# Patient Record
Sex: Female | Born: 1935 | Race: White | Hispanic: No
Health system: Southern US, Community
[De-identification: ages and names within clinical notes are randomized; demographics above are authoritative.]

---

## 2003-11-29 ENCOUNTER — Other Ambulatory Visit: Payer: Self-pay

## 2004-03-05 ENCOUNTER — Other Ambulatory Visit: Payer: Self-pay

## 2004-06-29 ENCOUNTER — Ambulatory Visit: Payer: Self-pay | Admitting: Urology

## 2005-03-23 ENCOUNTER — Ambulatory Visit: Payer: Self-pay | Admitting: Internal Medicine

## 2006-04-05 ENCOUNTER — Ambulatory Visit: Payer: Self-pay | Admitting: Internal Medicine

## 2006-08-31 ENCOUNTER — Ambulatory Visit: Payer: Self-pay | Admitting: Internal Medicine

## 2006-09-19 ENCOUNTER — Inpatient Hospital Stay: Payer: Self-pay | Admitting: Vascular Surgery

## 2006-09-19 ENCOUNTER — Other Ambulatory Visit: Payer: Self-pay

## 2006-10-08 ENCOUNTER — Other Ambulatory Visit: Payer: Self-pay

## 2006-10-08 ENCOUNTER — Inpatient Hospital Stay: Payer: Self-pay | Admitting: Internal Medicine

## 2006-10-09 ENCOUNTER — Other Ambulatory Visit: Payer: Self-pay

## 2006-11-14 ENCOUNTER — Ambulatory Visit: Payer: Self-pay | Admitting: Internal Medicine

## 2006-11-21 ENCOUNTER — Ambulatory Visit: Payer: Self-pay | Admitting: Internal Medicine

## 2006-12-07 ENCOUNTER — Ambulatory Visit: Payer: Self-pay | Admitting: Internal Medicine

## 2006-12-21 ENCOUNTER — Ambulatory Visit: Payer: Self-pay | Admitting: Internal Medicine

## 2007-01-21 ENCOUNTER — Ambulatory Visit: Payer: Self-pay | Admitting: Internal Medicine

## 2007-02-20 ENCOUNTER — Ambulatory Visit: Payer: Self-pay | Admitting: Internal Medicine

## 2007-03-23 ENCOUNTER — Ambulatory Visit: Payer: Self-pay | Admitting: Internal Medicine

## 2007-04-23 ENCOUNTER — Ambulatory Visit: Payer: Self-pay | Admitting: Internal Medicine

## 2007-04-25 ENCOUNTER — Emergency Department: Payer: Self-pay | Admitting: Emergency Medicine

## 2007-04-25 ENCOUNTER — Inpatient Hospital Stay (HOSPITAL_COMMUNITY): Admission: EM | Admit: 2007-04-25 | Discharge: 2007-05-01 | Payer: Self-pay | Admitting: Neurosurgery

## 2007-05-01 ENCOUNTER — Encounter: Payer: Self-pay | Admitting: Internal Medicine

## 2007-05-09 ENCOUNTER — Ambulatory Visit: Payer: Self-pay | Admitting: Oncology

## 2007-05-14 ENCOUNTER — Ambulatory Visit: Payer: Self-pay | Admitting: Internal Medicine

## 2007-05-23 ENCOUNTER — Ambulatory Visit: Payer: Self-pay | Admitting: Oncology

## 2007-05-23 ENCOUNTER — Ambulatory Visit: Payer: Self-pay | Admitting: Internal Medicine

## 2007-05-23 ENCOUNTER — Encounter: Payer: Self-pay | Admitting: Internal Medicine

## 2007-06-23 ENCOUNTER — Encounter: Payer: Self-pay | Admitting: Internal Medicine

## 2007-06-23 ENCOUNTER — Ambulatory Visit: Payer: Self-pay | Admitting: Oncology

## 2007-06-23 ENCOUNTER — Ambulatory Visit: Payer: Self-pay | Admitting: Internal Medicine

## 2007-07-23 ENCOUNTER — Ambulatory Visit: Payer: Self-pay | Admitting: Oncology

## 2007-07-23 ENCOUNTER — Encounter: Payer: Self-pay | Admitting: Internal Medicine

## 2007-07-26 ENCOUNTER — Ambulatory Visit: Payer: Self-pay | Admitting: Internal Medicine

## 2007-08-02 ENCOUNTER — Ambulatory Visit: Payer: Self-pay | Admitting: Oncology

## 2007-08-23 ENCOUNTER — Ambulatory Visit: Payer: Self-pay | Admitting: Oncology

## 2007-09-23 ENCOUNTER — Ambulatory Visit: Payer: Self-pay | Admitting: Oncology

## 2007-10-21 ENCOUNTER — Ambulatory Visit: Payer: Self-pay | Admitting: Oncology

## 2007-11-21 ENCOUNTER — Ambulatory Visit: Payer: Self-pay | Admitting: Oncology

## 2007-12-21 ENCOUNTER — Ambulatory Visit: Payer: Self-pay | Admitting: Oncology

## 2008-01-01 ENCOUNTER — Ambulatory Visit: Payer: Self-pay | Admitting: Internal Medicine

## 2008-02-13 ENCOUNTER — Ambulatory Visit: Payer: Self-pay | Admitting: Oncology

## 2008-02-20 ENCOUNTER — Ambulatory Visit: Payer: Self-pay | Admitting: Oncology

## 2008-04-22 ENCOUNTER — Ambulatory Visit: Payer: Self-pay | Admitting: Oncology

## 2008-05-14 ENCOUNTER — Ambulatory Visit: Payer: Self-pay | Admitting: Oncology

## 2008-05-22 ENCOUNTER — Ambulatory Visit: Payer: Self-pay | Admitting: Oncology

## 2008-06-24 IMAGING — CT CT HEAD WITHOUT CONTRAST
2 series · 16 of 30 positions shown, 20 images · non-contrast
Comparison: none

REASON FOR EXAM: head trauma  subdural hematoma  vertigo nausea
COMMENTS:

[Series 2: without · axial · non-contrast · 0.39mm/px · z∈[-148,-28]mm · 13 of 29 slices shown, 17 images]
[im 3/29  brain]
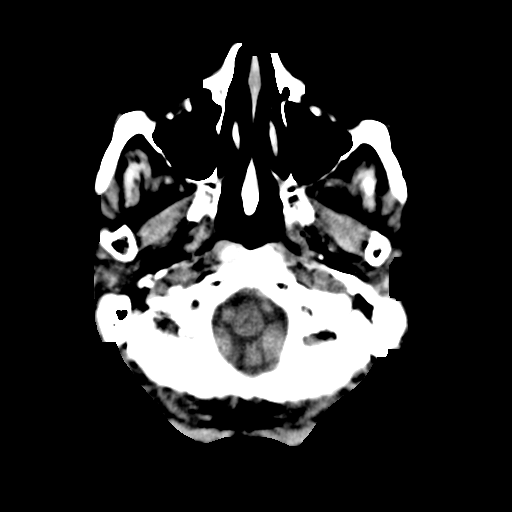
[im 3/29  bone]
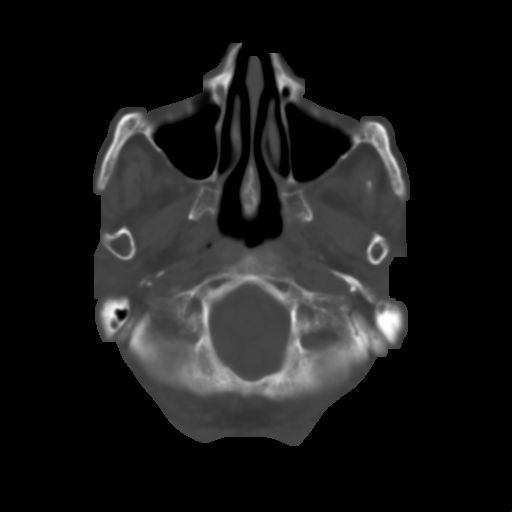
[im 5/29  brain]
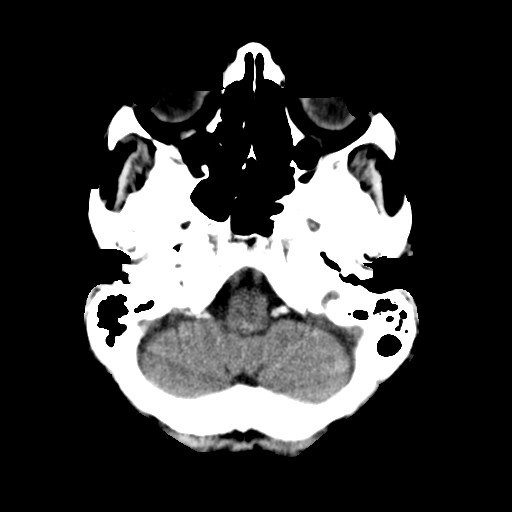
[im 7/29  brain]
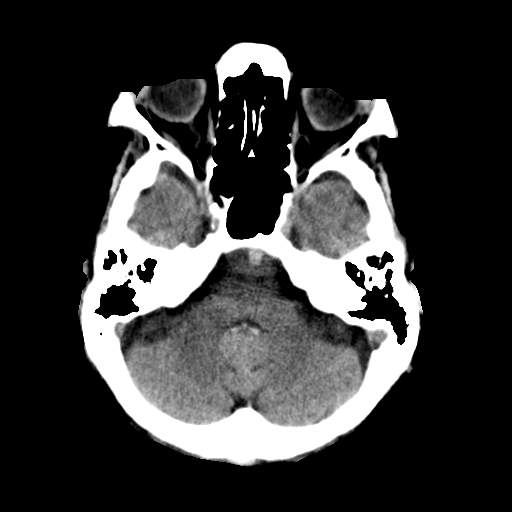
[im 9/29  brain]
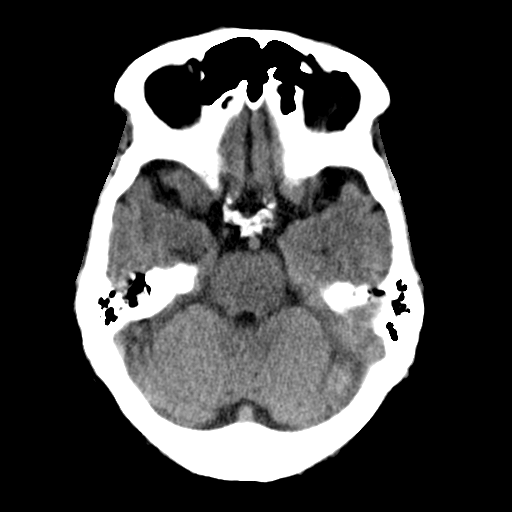
[im 11/29  brain]
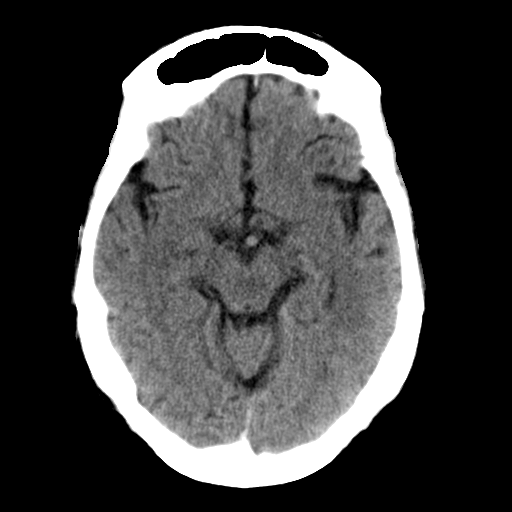
[im 11/29  bone]
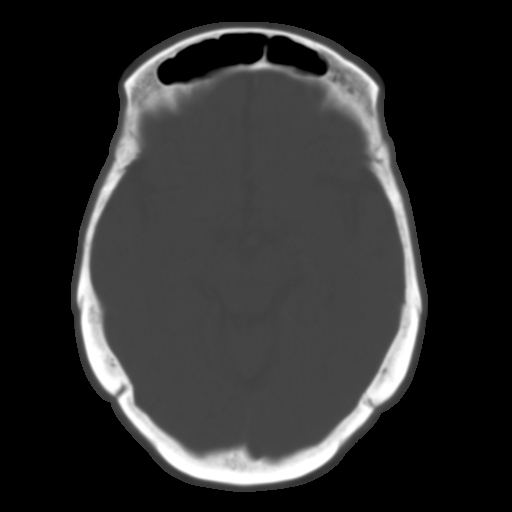
[im 13/29  brain]
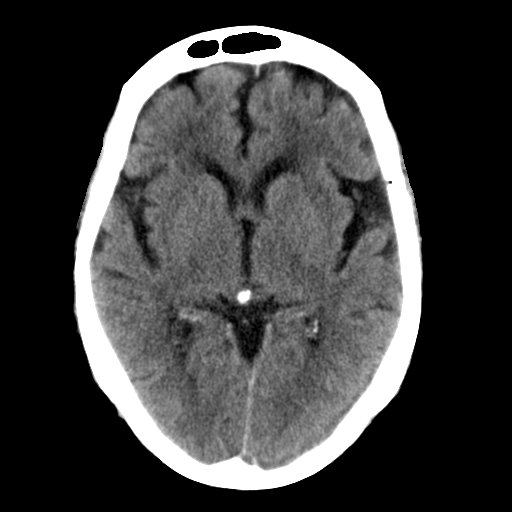
[im 15/29  brain]
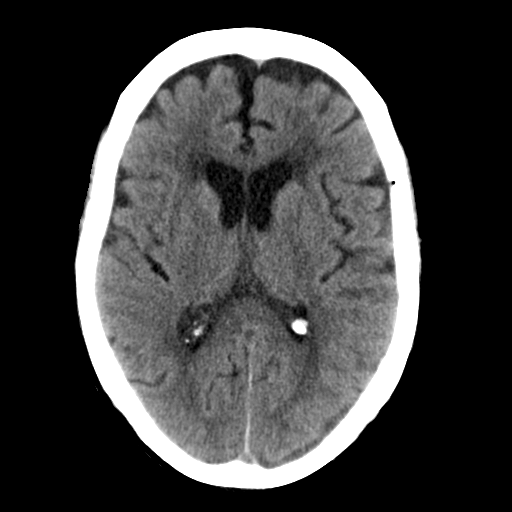
[im 17/29  brain]
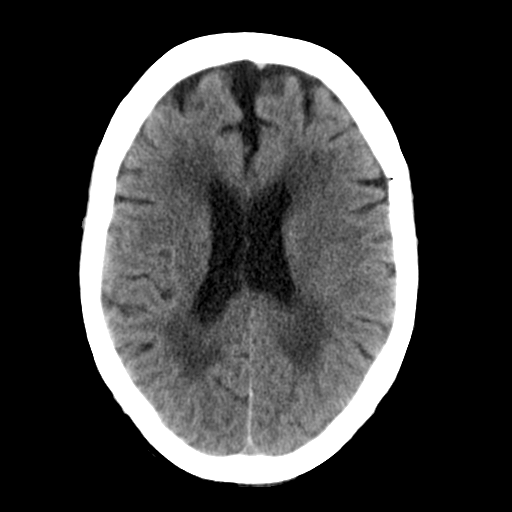
[im 19/29  brain]
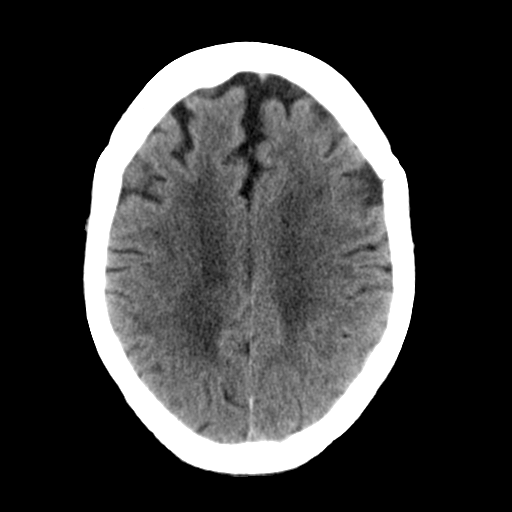
[im 19/29  bone]
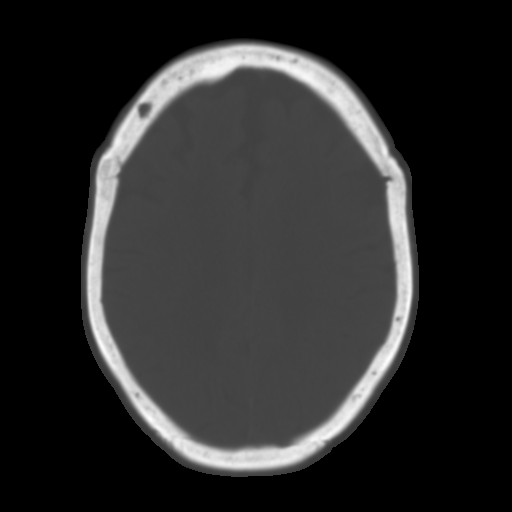
[im 21/29  brain]
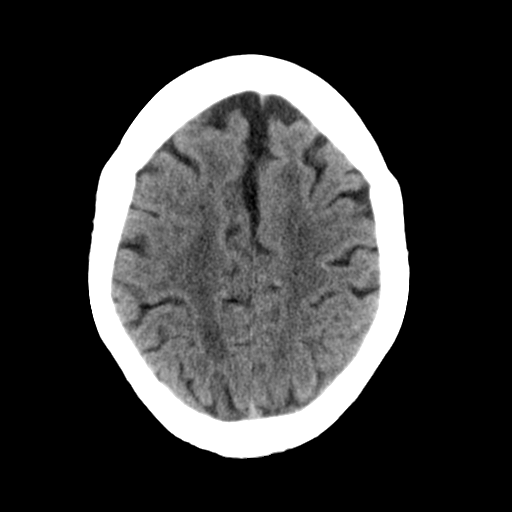
[im 23/29  brain]
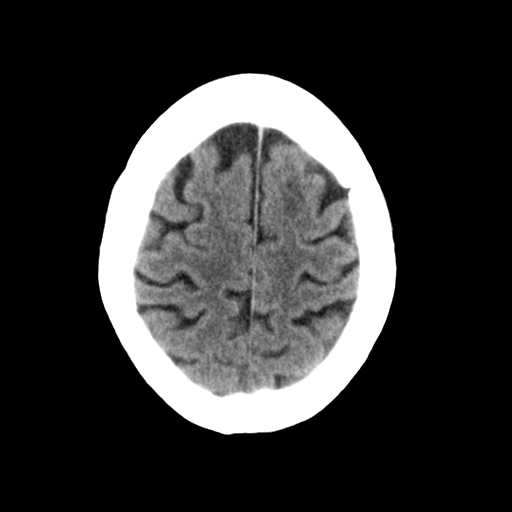
[im 25/29  brain]
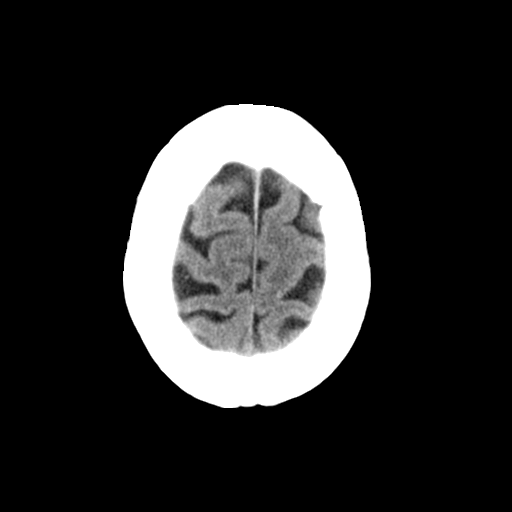
[im 27/29  brain]
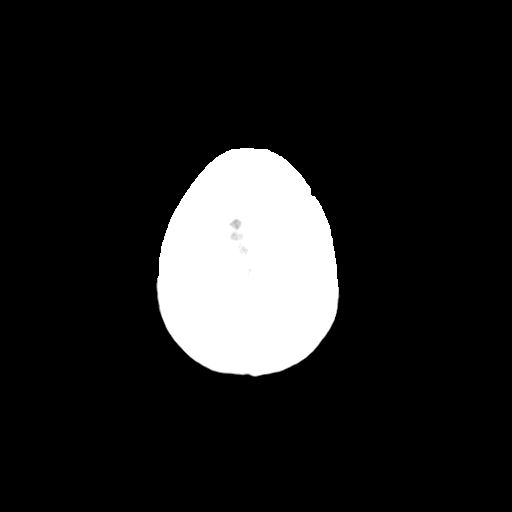
[im 27/29  bone]
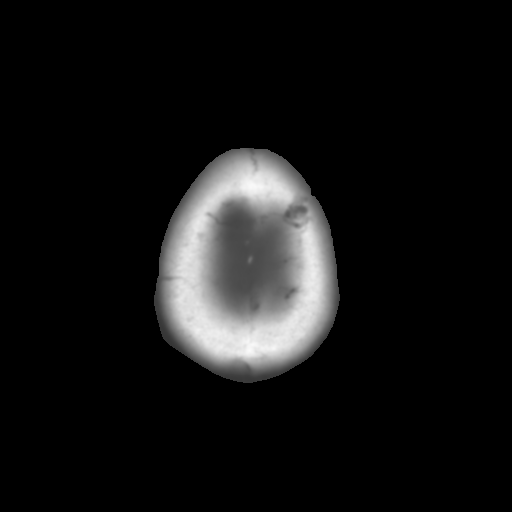

[Series 3: bone · axial · 0.39mm/px · z∈[-148,-108]mm · 3 of 29 slices shown]
[im 3/29  bone]
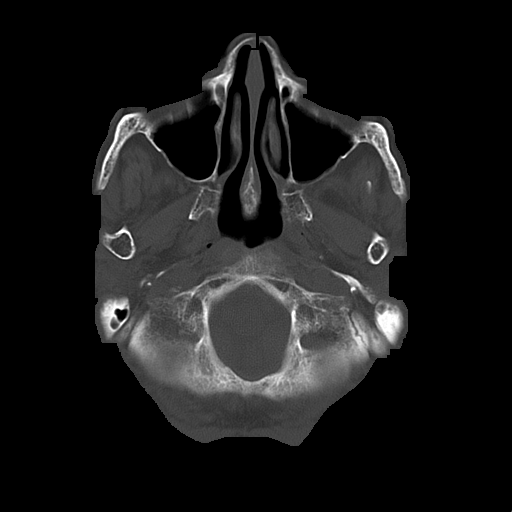
[im 7/29  bone]
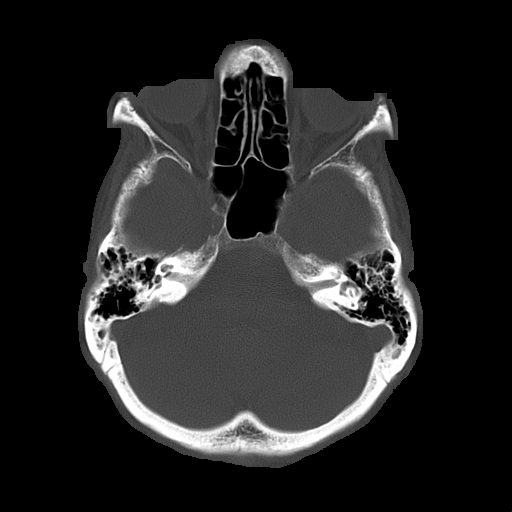
[im 11/29  bone]
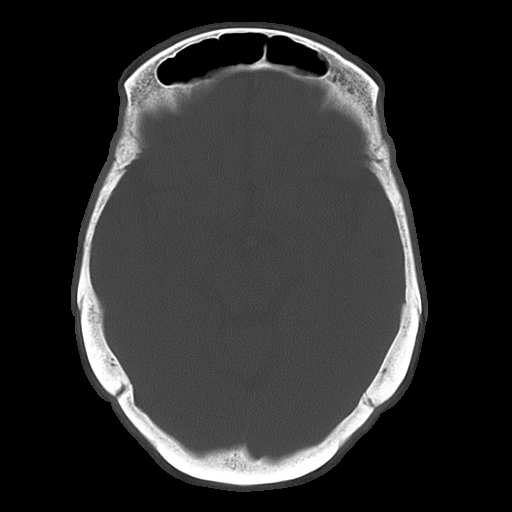

[16 of 30 positions shown; findings below may reference images not displayed]

PROCEDURE:     CT  - CT HEAD WITHOUT CONTRAST  - May 14, 2007  [DATE]

RESULT:     Comparison is made to a study 25 April, 2007. The patient
has sustained head trauma.

There is moderate diffuse cerebral and cerebellar atrophy. The ventricles
are normal in size and position. There is deep white matter hypodensity
consistent with chronic small vessel ischemic type change of aging. There is
no evidence of an acute intracranial hemorrhage or an evolving ischemic
infarction. At bone window settings I see no air-fluid levels and no
evidence of an acute skull fracture.
IMPRESSION: 1.     There are extensive chronic changes present within the brain which
are unchanged from the study [DATE]. There is no evidence of an
acute ischemic or hemorrhagic event. There is no evidence of hydrocephalus.

## 2008-08-22 ENCOUNTER — Ambulatory Visit: Payer: Self-pay | Admitting: Oncology

## 2008-09-05 IMAGING — CT CT CHEST W/O CM
1 of 2 series · 14 of 32 positions shown, 18 images · non-contrast
Comparison: none

REASON FOR EXAM: abnormal lung findings    pulmonary nodule   elevated
creatinine
COMMENTS:

PROCEDURE:     CT  - CT CHEST WITHOUT CONTRAST  - July 26, 2007 [DATE]
RESULT:
HISTORY: Lingular nodule.

[Series 3: lung windows · axial · 0.61mm/px · z∈[+231,+506]mm · 14 of 67 slices shown, 18 images]
[im 6/67  mediastinal]
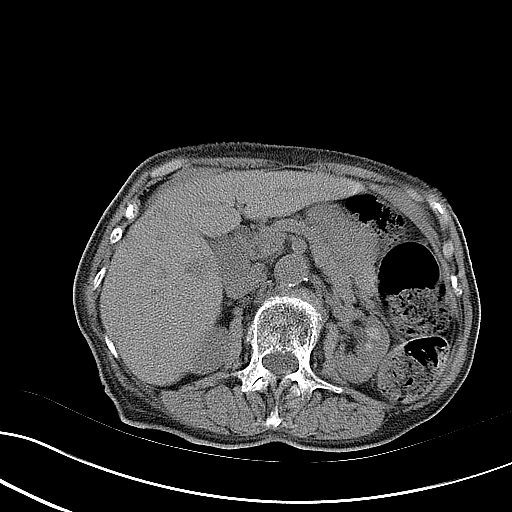
[im 6/67  lung]
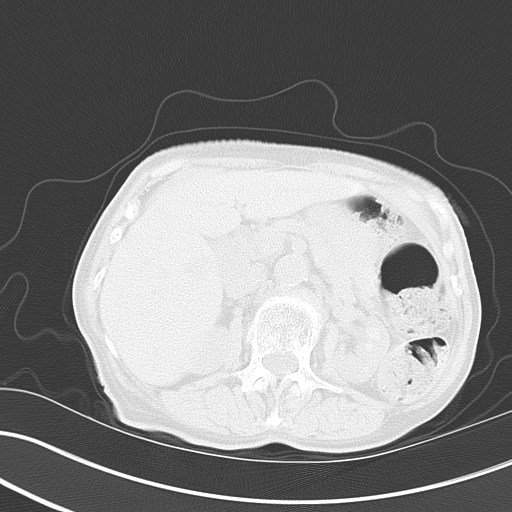
[im 11/67  lung]
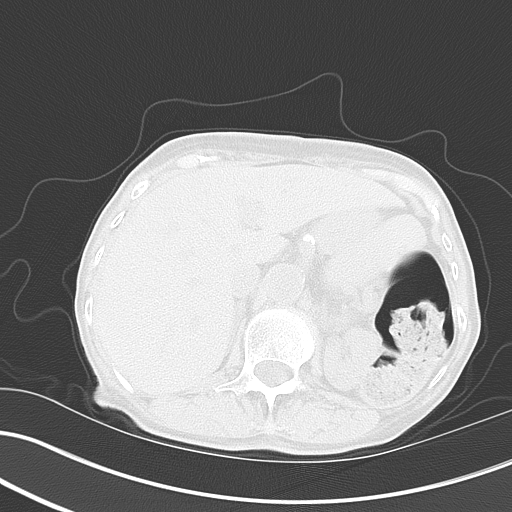
[im 16/67  lung]
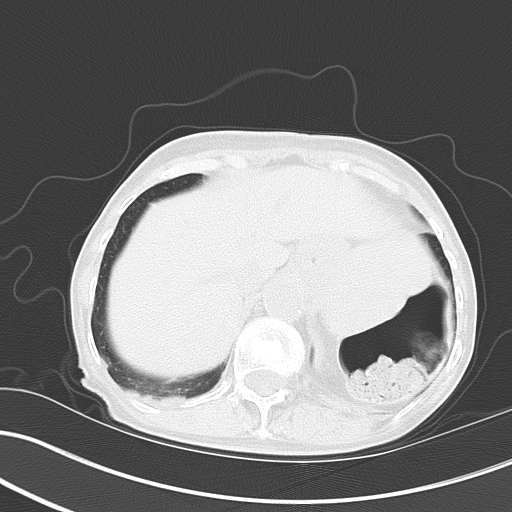
[im 21/67  lung]
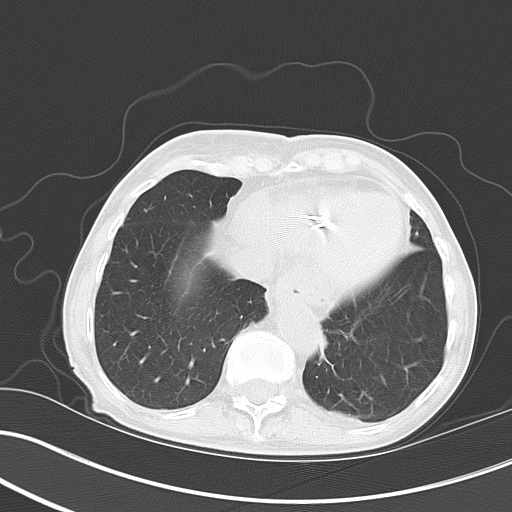
[im 26/67  mediastinal]
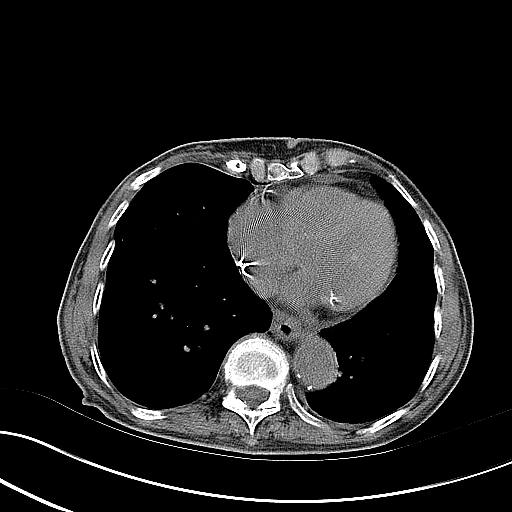
[im 26/67  lung]
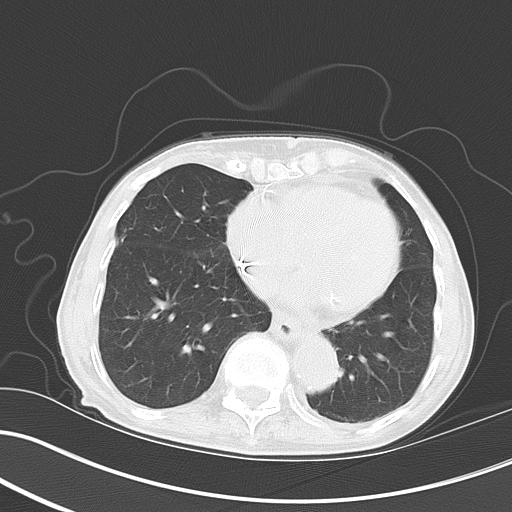
[im 31/67  lung]
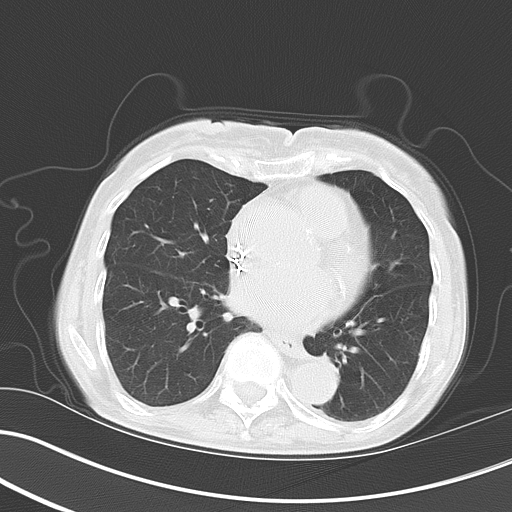
[im 32/67  lung]
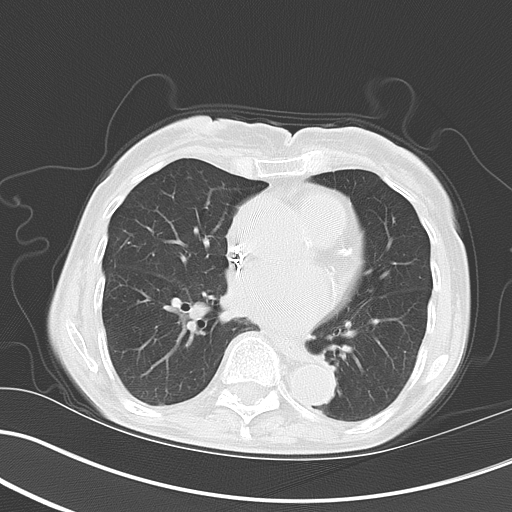
[im 34/67  lung]
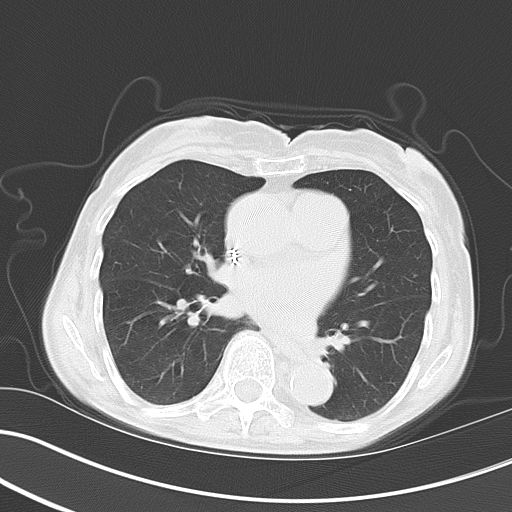
[im 36/67  mediastinal]
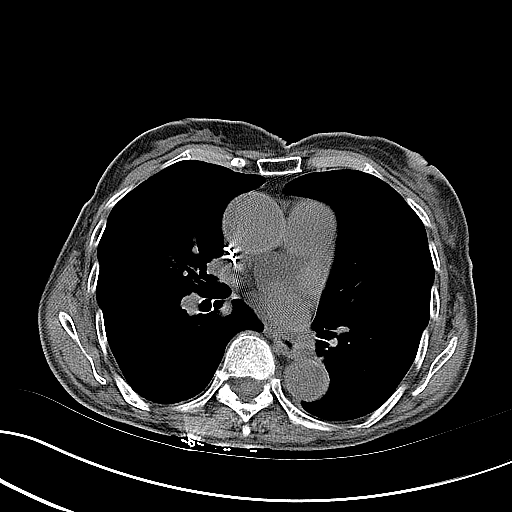
[im 36/67  lung]
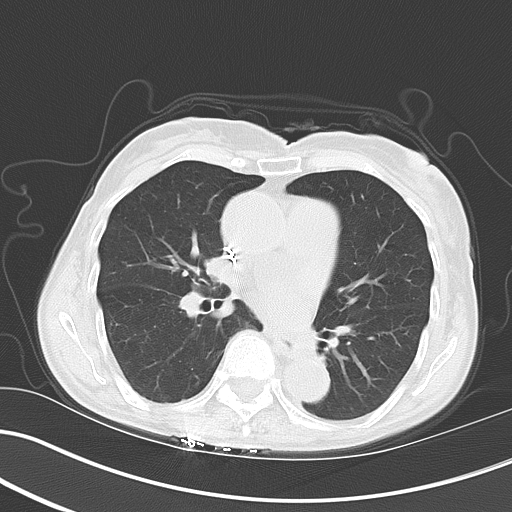
[im 41/67  lung]
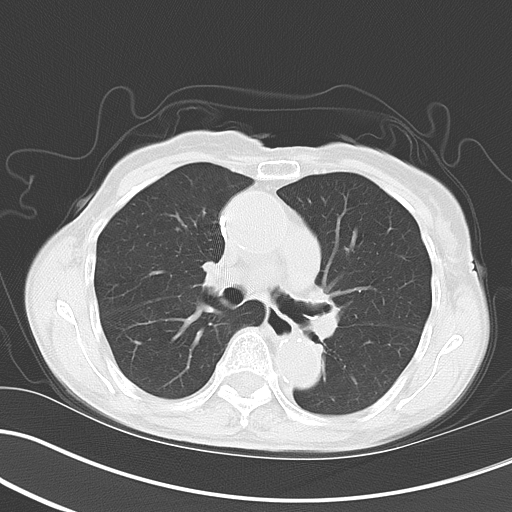
[im 46/67  lung]
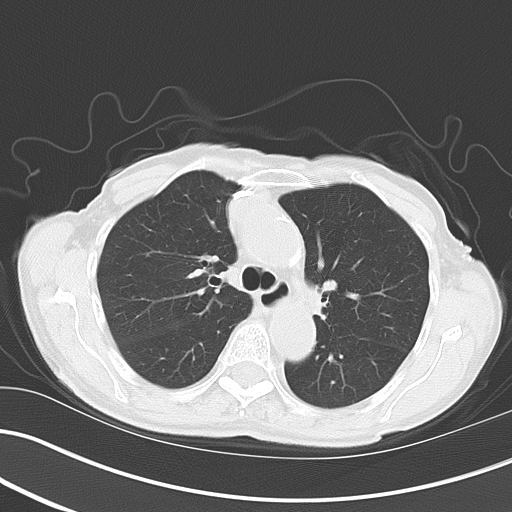
[im 51/67  lung]
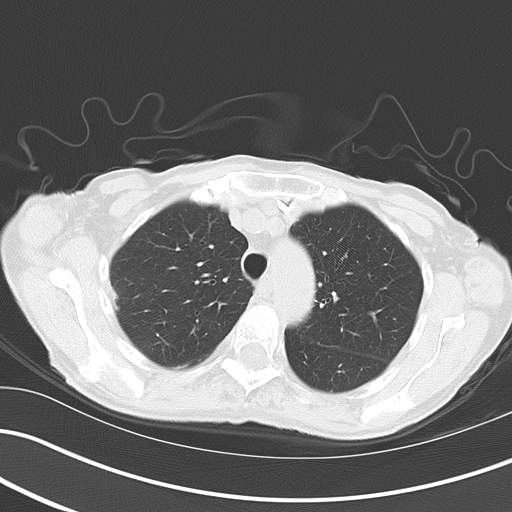
[im 56/67  mediastinal]
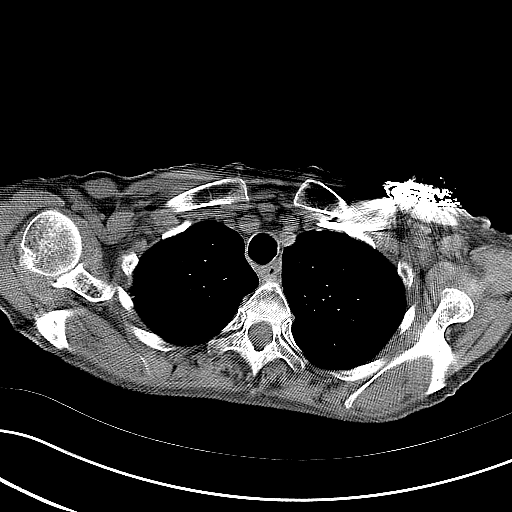
[im 56/67  lung]
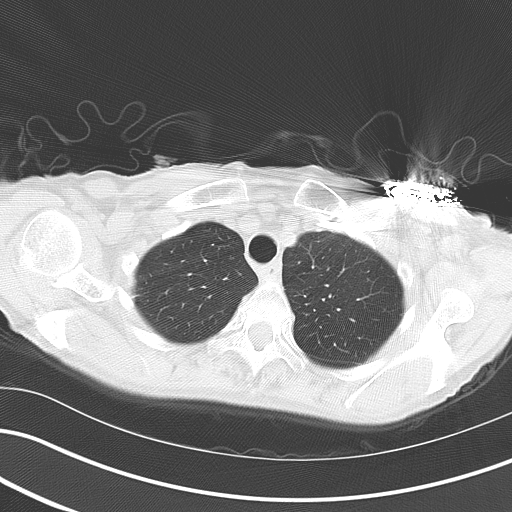
[im 61/67  lung]
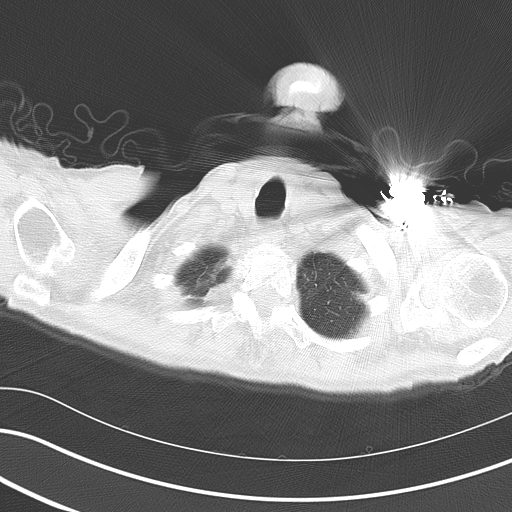

[14 of 32 positions shown; findings below may reference images not displayed]

COMPARISON STUDIES:   Portable chest of 04/25/07 and chest CT of 12/07/06.

PROCEDURE AND FINDINGS:  Nonenhanced CT of the chest was obtained.  The
patient has an elevated Creatinine. No IV contrast was administered.
Previously identified lingular nodule is stable and unchanged in size and
shape. The remainder of the lung fields are essentially clear with basilar
atelectasis and/or scarring.  Small mediastinal lymph nodes are noted.
Pulmonary artery disease is present.  RIGHT renal cyst is present.  The
patient has a cardiac pacer.  The mediastinum and hilar structures are
difficult to evaluate due to absence of IV contrast. Chest wall is
unremarkable.
IMPRESSION: Stable tiny lingular nodule.  A full year follow up chest CT would be due
[DATE]. 6-month follow up chest CT for a two year period can be obtained to
demonstrate stability as clinically indicated. This nodule can be followed
at the pulmonary nodule clinic with oncology as so desired.

## 2008-09-11 ENCOUNTER — Ambulatory Visit: Payer: Self-pay | Admitting: Oncology

## 2008-09-22 ENCOUNTER — Ambulatory Visit: Payer: Self-pay | Admitting: Oncology

## 2008-11-20 ENCOUNTER — Ambulatory Visit: Payer: Self-pay | Admitting: Oncology

## 2008-12-10 ENCOUNTER — Ambulatory Visit: Payer: Self-pay | Admitting: Oncology

## 2008-12-20 ENCOUNTER — Ambulatory Visit: Payer: Self-pay | Admitting: Oncology

## 2009-02-02 ENCOUNTER — Ambulatory Visit: Payer: Self-pay | Admitting: Internal Medicine

## 2009-12-22 ENCOUNTER — Ambulatory Visit: Payer: Self-pay | Admitting: Urology

## 2010-01-05 ENCOUNTER — Ambulatory Visit: Payer: Self-pay | Admitting: Urology

## 2011-01-04 NOTE — Discharge Summary (Signed)
NAME:  Alice Hensley, Alice Hensley NO.:  [id number]   MEDICAL RECORD NO.:  [id number]          PATIENT TYPE:  INP   LOCATION:  3017                         FACILITY:  [facility]   PHYSICIAN:  Reinaldo Meeker, M.D. DATE OF BIRTH:  04/03/1919   DATE OF ADMISSION:  04/25/2007  DATE OF DISCHARGE:  05/01/2007                               DISCHARGE SUMMARY   FINAL DIAGNOSIS:  Left parietal subdural hematoma.   PRIMARY OPERATIVE PROCEDURE:  None.   HISTORY:  Alice Hensley is an 77 year old female with a reported syncopal  episode today with a subsequent fall.  She went to the [facility]  Emergency Room where CT scan showed a thin left parietal subdural.  She  was transferred to [facility] for neurosurgical care.  When admitted and  during her entire hospital stay, the patient was awake, alert, and  oriented with normal strength and could follow complex commands.  Her  verbalization was appropriate and fluent.  Her CT scan did show the thin  subdural hematoma.  The patient was admitted for observation to the ICU  and, over subsequent days, was able to slowly increase her activities.  She still had some vertigo which had been an ongoing long problem.  The  plan was for her to follow up with her medical doctor after discharge  and get that reevaluated.  She had three serial CT scans which showed  only some early resolution and no worsening of her subdural and no mass  effect or midline shift.  By May 01, 2007, she was tolerating a  regular diet, sitting up in chair, and so we increased her activities.  It was felt that she was ready for release from the acute care but  needed standard nursing facility and one was found in [place] for her  further treatment.  The plan was for her to contact her medical doctor,  Dr. Dellis Filbert, so he could reevaluate her syncope and vertigo.   CONDITION:  Her condition was improved versus admission.   DISCHARGE MEDICATIONS:  Bactrim, Darvocet, imipramine,  Toprol,  nitrofurantoin, and pravastatin.           ______________________________  Reinaldo Meeker, M.D.     ROK/MEDQ  D:  05/01/2007  T:  05/01/2007  Job:  [id number]

## 2011-06-03 LAB — URINALYSIS, ROUTINE W REFLEX MICROSCOPIC
Bilirubin Urine: NEGATIVE
Nitrite: NEGATIVE
Specific Gravity, Urine: 1.011
pH: 6

## 2011-06-03 LAB — URINE MICROSCOPIC-ADD ON

## 2011-07-20 ENCOUNTER — Ambulatory Visit: Payer: Self-pay | Admitting: Internal Medicine

## 2012-05-23 ENCOUNTER — Emergency Department: Payer: Self-pay

## 2012-12-03 ENCOUNTER — Emergency Department: Payer: Self-pay | Admitting: Emergency Medicine

## 2012-12-03 LAB — CBC WITH DIFFERENTIAL/PLATELET
Basophil %: 0.3 %
Eosinophil %: 0 %
Lymphocyte %: 12.3 %
MCH: 34.9 pg — ABNORMAL HIGH (ref 26.0–34.0)
MCHC: 33.1 g/dL (ref 32.0–36.0)
Monocyte #: 0.6 x10 3/mm (ref 0.2–0.9)
Neutrophil %: 79.4 %
Platelet: 134 10*3/uL — ABNORMAL LOW (ref 150–440)
RBC: 2.47 10*6/uL — ABNORMAL LOW (ref 3.80–5.20)

## 2012-12-03 LAB — COMPREHENSIVE METABOLIC PANEL
Albumin: 3.4 g/dL (ref 3.4–5.0)
Alkaline Phosphatase: 116 U/L (ref 50–136)
Anion Gap: 10 (ref 7–16)
BUN: 82 mg/dL — ABNORMAL HIGH (ref 7–18)
Glucose: 103 mg/dL — ABNORMAL HIGH (ref 65–99)
Osmolality: 303 (ref 275–301)
Potassium: 4.4 mmol/L (ref 3.5–5.1)

## 2012-12-03 LAB — URINALYSIS, COMPLETE
Ketone: NEGATIVE
RBC,UR: <1 /HPF (ref 0–5)
Squamous Epithelial: 1
WBC UR: 3 /HPF (ref 0–5)

## 2012-12-03 LAB — TROPONIN I: Troponin-I: 0.04 ng/mL

## 2012-12-03 LAB — CK: CK, Total: 119 U/L (ref 21–215)

## 2012-12-09 LAB — CULTURE, BLOOD (SINGLE)

## 2013-01-17 LAB — CBC WITH DIFFERENTIAL/PLATELET
Basophil #: 0 10*3/uL (ref 0.0–0.1)
Basophil %: 0.5 %
Eosinophil #: 0 10*3/uL (ref 0.0–0.7)
Lymphocyte #: 0.9 10*3/uL — ABNORMAL LOW (ref 1.0–3.6)
Lymphocyte %: 13.4 %
MCH: 33.3 pg (ref 26.0–34.0)
MCV: 102 fL — ABNORMAL HIGH (ref 80–100)
Monocyte %: 10 %
Neutrophil #: 5.3 10*3/uL (ref 1.4–6.5)
WBC: 6.9 10*3/uL (ref 3.6–11.0)

## 2013-01-17 LAB — BASIC METABOLIC PANEL
Calcium, Total: 9 mg/dL (ref 8.5–10.1)
Chloride: 106 mmol/L (ref 98–107)
Co2: 28 mmol/L (ref 21–32)
Creatinine: 1.88 mg/dL — ABNORMAL HIGH (ref 0.60–1.30)
EGFR (Non-African Amer.): 23 — ABNORMAL LOW
Osmolality: 294 (ref 275–301)
Sodium: 140 mmol/L (ref 136–145)

## 2013-01-18 ENCOUNTER — Inpatient Hospital Stay: Payer: Self-pay | Admitting: Internal Medicine

## 2013-01-19 LAB — CBC WITH DIFFERENTIAL/PLATELET
Basophil #: 0 10*3/uL (ref 0.0–0.1)
Basophil %: 0.6 %
Eosinophil #: 0.1 10*3/uL (ref 0.0–0.7)
HCT: 27.2 % — ABNORMAL LOW (ref 35.0–47.0)
HGB: 9 g/dL — ABNORMAL LOW (ref 12.0–16.0)
Lymphocyte #: 1 10*3/uL (ref 1.0–3.6)
Lymphocyte %: 16.5 %
MCHC: 33.1 g/dL (ref 32.0–36.0)
Monocyte #: 0.7 x10 3/mm (ref 0.2–0.9)
Neutrophil %: 69.9 %

## 2013-01-19 LAB — BASIC METABOLIC PANEL
Anion Gap: 7 (ref 7–16)
BUN: 48 mg/dL — ABNORMAL HIGH (ref 7–18)
Calcium, Total: 8.2 mg/dL — ABNORMAL LOW (ref 8.5–10.1)
Co2: 29 mmol/L (ref 21–32)
Glucose: 97 mg/dL (ref 65–99)
Osmolality: 294 (ref 275–301)
Sodium: 141 mmol/L (ref 136–145)

## 2013-01-20 LAB — BASIC METABOLIC PANEL
Anion Gap: 7 (ref 7–16)
BUN: 50 mg/dL — ABNORMAL HIGH (ref 7–18)
Chloride: 102 mmol/L (ref 98–107)
EGFR (African American): 23 — ABNORMAL LOW
EGFR (Non-African Amer.): 20 — ABNORMAL LOW
Glucose: 99 mg/dL (ref 65–99)
Osmolality: 289 (ref 275–301)
Sodium: 138 mmol/L (ref 136–145)

## 2013-01-21 LAB — BASIC METABOLIC PANEL
BUN: 58 mg/dL — ABNORMAL HIGH (ref 7–18)
Calcium, Total: 8.8 mg/dL (ref 8.5–10.1)
EGFR (African American): 22 — ABNORMAL LOW
EGFR (Non-African Amer.): 19 — ABNORMAL LOW
Osmolality: 294 (ref 275–301)
Potassium: 4.5 mmol/L (ref 3.5–5.1)

## 2013-01-23 LAB — CULTURE, BLOOD (SINGLE)

## 2013-02-12 ENCOUNTER — Emergency Department: Payer: Self-pay | Admitting: Emergency Medicine

## 2013-02-12 LAB — CK TOTAL AND CKMB (NOT AT ARMC)
CK, Total: 31 U/L (ref 21–215)
CK-MB: 1.9 ng/mL (ref 0.5–3.6)

## 2013-02-12 LAB — TROPONIN I: Troponin-I: 0.02 ng/mL

## 2013-02-12 LAB — URINALYSIS, COMPLETE
Blood: NEGATIVE
Glucose,UR: NEGATIVE mg/dL (ref 0–75)
Nitrite: POSITIVE
RBC,UR: 1 /HPF (ref 0–5)
Specific Gravity: 1.009 (ref 1.003–1.030)
WBC UR: 38 /HPF (ref 0–5)

## 2013-02-12 LAB — CBC
HGB: 9.2 g/dL — ABNORMAL LOW (ref 12.0–16.0)
MCH: 33.8 pg (ref 26.0–34.0)
MCV: 100 fL (ref 80–100)
Platelet: 137 10*3/uL — ABNORMAL LOW (ref 150–440)
RBC: 2.73 10*6/uL — ABNORMAL LOW (ref 3.80–5.20)
RDW: 13.8 % (ref 11.5–14.5)

## 2013-02-12 LAB — COMPREHENSIVE METABOLIC PANEL
Anion Gap: 5 — ABNORMAL LOW (ref 7–16)
Co2: 31 mmol/L (ref 21–32)
EGFR (African American): 24 — ABNORMAL LOW
Glucose: 85 mg/dL (ref 65–99)

## 2013-06-12 ENCOUNTER — Emergency Department: Payer: Self-pay | Admitting: Internal Medicine

## 2013-06-28 ENCOUNTER — Emergency Department: Payer: Self-pay | Admitting: Emergency Medicine

## 2013-07-14 ENCOUNTER — Emergency Department: Payer: Self-pay | Admitting: Emergency Medicine

## 2013-08-02 ENCOUNTER — Emergency Department: Payer: Self-pay | Admitting: Emergency Medicine

## 2013-08-02 LAB — URINALYSIS, COMPLETE
Blood: NEGATIVE
Glucose,UR: NEGATIVE mg/dL (ref 0–75)
Nitrite: NEGATIVE
RBC,UR: <1 /HPF (ref 0–5)
Squamous Epithelial: 2

## 2013-09-22 DEATH — deceased

## 2014-12-12 NOTE — Discharge Summary (Signed)
PATIENT NAME:  Alice Hensley, Alice Hensley MR#:  [id number] DATE OF BIRTH:  22-Apr-1919  DATE OF ADMISSION:  01/18/2013 DATE OF DISCHARGE:    DISCHARGE DIAGNOSES:  1.  Pubic rami and sacral fractures.  2.  Congestive heart failure secondary to mitral regurgitation, ejection fraction 45%.  3.  Dementia, moderate.  4.  Hypertension.   DISCHARGE MEDICATIONS:  1.  Norco 5/325 mg 1 t.i.d. p.r.n. pain.  2.  Amlodipine 5 mg daily.  3.  Calcitonin one spray daily alternating nostrils.  4.  Calcium 500 mg/vitamin D 200 units b.i.d. 5.  Vitamin D 3000 units daily.  6.  Metoprolol succinate 100 mg daily.  7.  Temazepam 7.5 mg at bedtime.  8.  Torsemide 10 mg daily.  9.  Xanax 0.25 mg b.i.d.   REASON FOR ADMISSION: A 81 year old female presents with a fracture of the sacrum and pubic rami with significant CHF. Please see H and P for HPI, past medical history and physical exam.   HOSPITAL COURSE: The patient was admitted and diuresed with IV Lasix. Creatinine went from 1.7 up to 2.2 on discharge as her IV Lasix which was switched to torsemide. Echocardiogram showed LVF of 45% with moderate MR and elevated pulmonary pressures. All of her edema resolved with the diuresis. Her pain from the fractures was really fairly minimal. Tylenol was usually enough, rarely needed Norco. She was placed on vitamin D and Miacalcin. Her agitation with her dementia was moderate. She was given haloperidol several times to help her sleep. She will be getting back to assisted living with physical therapy and nursing. Overall prognosis is poor.  ____________________________ Danella PentonMark F. Meena Barrantes, MD mfm:aw D: 01/21/2013 08:00:20 ET T: 01/21/2013 09:27:33 ET JOB#: [id number]  cc: Danella PentonMark F. Jerolene Kupfer, MD, <Dictator> Emilianna Barlowe Sherlene ShamsF Nolen Lindamood MD ELECTRONICALLY SIGNED 01/22/2013 8:21

## 2014-12-12 NOTE — H&P (Signed)
PATIENT NAME:  Alice Hensley, Alice Hensley MR#:  [id number] DATE OF BIRTH:  1919/05/19  DATE OF ADMISSION:  01/17/2013  PRIMARY CARE PHYSICIAN:  Dr. Bethann PunchesMark Miller.   REFERRING PHYSICIAN:  Dr. Shaune PollackLord.   CHIEF COMPLAINT:  Fall and shortness of breath.   HISTORY OF PRESENT ILLNESS:  The patient is a 81 year old pleasant female with a past medical history of chronic anemia and vitamin B12 deficiency, follows with Dr. Doylene Canninghoksi as an outpatient, chronic renal insufficiency, hypertension and multiple other medical problems, was sent over from the nursing home after she sustained a fall.  According to the patient's daughter who is at bedside, the patient went to the bathroom and she tripped over and fell on the bathroom floor.  The patient was also complaining of shortness of breath for the past few days and has minimal cough.  The patient was sent over to the ER for further evaluation.  Here, the patient had a pubic rami fracture as well as sacral fracture.  CT of the chest, abdomen and pelvis were done which has revealed some pleural effusions and consolidation versus atelectasis.  The patient was also wheezing when she came in.  ER physician has diagnosed her with pneumonia and she was given IV Rocephin.  Also, she was given breathing treatments.  She has received labetalol IV for malignant hypertension.  The patient has received morphine IV for pain management.  Eventually during my examination pain is well-controlled and she reports the pain as 2 out of 10.  Her shortness of breath is also significantly improved and patient is reporting that in the past two weeks she was so short of breath and now she started feeling better.  The patient had some degree of dementia and most of the history is obtained from the patient's daughter at bedside.   PAST MEDICAL HISTORY:  Hypertension, chronic history of anemia, chronic renal insufficiency, vitamin B12 deficiency, intermittent vertigo, chronic arthritis, dementia, history of  palpitations, narrow complex tachycardia treated with beta-blocker, a history of chest pain extensively worked up in the past, history of renal cyst.   PAST SURGICAL HISTORY:  Pacemaker placement, thyroidectomy, appendectomy, hysterectomy, bladder suspension.   ALLERGIES:  The patient is allergic to CODEINE AND LEVAQUIN.   PSYCHOSOCIAL HISTORY:  Residing in a nursing home, past history of tobacco, but quit smoking.  Denies any alcohol intake.   FAMILY HISTORY:  Daughter has breast cancer.   HOME MEDICATIONS:  Tylenol 500 mg q. 6 hours, Phenergan 12.5 mg every 6 hours as needed for nausea and vomiting, Norco 1 tablet by mouth q. 6 hours as needed for pain, metoprolol succinate 100 mg once daily, hydrochlorothiazide 12.5 mg 1 tablet by mouth once daily.   REVIEW OF SYSTEMS:  CONSTITUTIONAL:  Denies any fever or fatigue.  Denies any weakness.  Pain is 2 out of 10, but chronic extremity edema for the past two months.  EYES:  Denies any blurry vision, redness or inflammation.  EARS, NOSE, THROAT:  Denies any ear pain, epistaxis, discharge or difficulty in swallowing.  RESPIRATORY:  Complaining of cough and shortness of breath for the past two weeks.  Denies any history of hemoptysis or painful respiration.  CARDIOVASCULAR:  No chest pain, palpitations or syncope.  GASTROINTESTINAL:  No nausea, vomiting, diarrhea.  Denies any abdominal pain or hematemesis.  GENITOURINARY:  Denies any dysuria or hematuria.  GYNECOLOGIC AND BREAST:  No breast mass or vaginal discharge.  ENDOCRINE:  Denies any history of polyuria, nocturia, increased sweating.  HEMATOLOGIC  AND LYMPHATIC:  Chronic history of anemia is present from vitamin B12 deficiency and follows with Dr. Doylene Canning, hematologist.  Denies any bleeding or swollen glands.  INTEGUMENTARY:  No acne, rash, lesions.  MUSCULOSKELETAL:  Pain in the back from the pubic rami fracture and sacral fracture.  Chronic history of arthritis present.  Denies any history  of GERD.  NEUROLOGIC:  No history of vertigo, ataxia.  Has history of dementia.  PSYCHIATRIC:  Denies any history of anxiety, insomnia, ADD.   PHYSICAL EXAMINATION: VITAL SIGNS:  Temperature 97.9, pulse 76, respirations 18, blood pressure 196/116, but after getting labetalol blood pressure was at 155/85, pulse ox 91%.  GENERAL APPEARANCE:  Not under acute distress.  Moderately built and moderately nourished.  HEENT:  Normocephalic.  Pupils are equally reacting light and accommodation.  No conjunctival injection.  No scleral icterus.  No sinus tenderness.  No postnasal drip.  No pharyngeal exudates.  Moist mucous membranes.  NECK:  Supple.  No JVD.  No thyromegaly.  No lymphadenopathy.  LUNGS:  Decreased breath sounds at the bases with some rales and rhonchi, but no accessory muscle usage.  No anterior chest wall tenderness on palpation.   CARDIOVASCULAR:  S1, S2 normal.  Regular rate and rhythm.  Positive murmur. GASTROINTESTINAL:  Soft.  Bowel sounds are positive in all four quadrants.  Nontender, nondistended.  No masses felt.  No hepatosplenomegaly.  NEUROLOGIC:  Awake and alert, but oriented x 1 to 2, follows verbal commands.  Motor and sensory are grossly intact.  Reflexes are 2+.  Cranial nerves II through XII are intact.  EXTREMITIES:  1 to 2+ pitting edema is present which is chronic according to the daughter.  No calf tenderness is present.  No cyanosis or clubbing.  MUSCULOSKELETAL:  No joint effusion, tenderness or erythema.  INTEGUMENTARY:  No acne, rash, lesions.  Normal skin turgor.  Warm to touch.   LABORATORY DATA AND IMAGING STUDIES:  CT of the pelvis without contrast has revealed comminuted superior and inferior pubic rami fracture involving the left portion of the sacrum.  CT of the chest and abdomen has revealed bilateral effusions, atelectasis versus infiltrate within the lung bases within the right lower lobe area which may represent consolidation versus atelectasis in the  lung.  Pulmonary nodule in the left, cardiomegaly.  Serum glucose is 110, BUN 15, creatinine 1.88 which is relatively better when compared to the previous values and the creatinine was at 2.2, sodium 140, potassium 4.6, chloride 106, CO2 28, anion gap is 6.  GFR is 26, serum osmolality 294, calcium 9.0.  WBC 6.9, hemoglobin 10.2, hematocrit 31.1, platelets 137,000, MCV 102.   ASSESSMENT AND PLAN:  A 81 year old female brought into the Emergency Room from the nursing home after she sustained a fall and complaining of shortness of breath for the past 1 to 2 weeks, will be admitted with the following assessment and plan. 1.  Shortness of breath probably from healthcare-associated pneumonia as she resides in a nursing home with some parapneumonic effusions, ? component of congestive heart failure.  Plan:  We will give her Zosyn and vancomycin.  The patient is allergic to QUINOLONES.  We will obtain 2-D echocardiogram to evaluate left ventricular ejection fraction.  Also we will provide her IV Lasix.  2.  Pubic rami and sacral fracture from the fall.  We will provide her pain treatment with Norco for mild to moderate pain and morphine IV for severe pain with close monitoring for symptoms and signs of  sedation.  3.  Chronic renal insufficiency.  Renal function is at her baseline, but as well we are going to give her Lasix and vancomycin.  We will monitor renal function closely.  Pharmacy to dose vancomycin.  4.  Chronic history of anemia from vitamin B12 deficiency.  The patient is to resume her vitamin B12 shots as recommended by her hematologist after discharge.  5.  Hypertension.  Blood pressure is elevated from the pain.  We will continue pain management and resume her home medication metoprolol.  6.  We will provide her gastrointestinal prophylaxis and deep vein thrombosis prophylaxis with TEDs and heparin subQ.  7.  The patient is DO NOT RESUSCITATE.   Medical power of attorney is patient's daughter Ms.  Cottie Banda.    The patient will be transferred to [facility] in the a.m.   Total time spent on the admission is 55 minutes.    We will consult physical therapy regarding pain management and deconditioning.   Case management counseled regarding discharge and planning.     ____________________________ Ramonita Lab, MD ag:ea D: 01/18/2013 01:39:57 ET T: 01/18/2013 02:43:57 ET JOB#: [id number]  cc: Ramonita Lab, MD, <Dictator> Danella Penton, MD Ramonita Lab MD ELECTRONICALLY SIGNED 01/19/2013 6:46
